# Patient Record
Sex: Female | Born: 1935 | Race: White | Hispanic: No | Marital: Single
Health system: Southern US, Community
[De-identification: ages and names within clinical notes are randomized; demographics above are authoritative.]

---

## 2013-06-01 ENCOUNTER — Inpatient Hospital Stay: Payer: Self-pay | Admitting: Internal Medicine

## 2013-06-01 LAB — CBC
HCT: 39.6 % (ref 35.0–47.0)
HGB: 13.5 g/dL (ref 12.0–16.0)
MCHC: 34.1 g/dL (ref 32.0–36.0)
Platelet: 230 10*3/uL (ref 150–440)
RBC: 4.13 10*6/uL (ref 3.80–5.20)
RDW: 12.6 % (ref 11.5–14.5)
WBC: 6.4 10*3/uL (ref 3.6–11.0)

## 2013-06-01 LAB — URINALYSIS, COMPLETE
Blood: NEGATIVE
Ketone: NEGATIVE
Leukocyte Esterase: NEGATIVE
Nitrite: NEGATIVE
Protein: NEGATIVE
RBC,UR: 1 /HPF (ref 0–5)
Specific Gravity: 1.008 (ref 1.003–1.030)
Squamous Epithelial: <1
WBC UR: <1 /HPF (ref 0–5)

## 2013-06-01 LAB — TROPONIN I: Troponin-I: <0.02 ng/mL

## 2013-06-01 LAB — BASIC METABOLIC PANEL
BUN: 8 mg/dL (ref 7–18)
Calcium, Total: 8.6 mg/dL (ref 8.5–10.1)
Chloride: 95 mmol/L — ABNORMAL LOW (ref 98–107)
Osmolality: 257 (ref 275–301)

## 2013-06-01 LAB — URIC ACID: Uric Acid: 3.6 mg/dL (ref 2.6–6.0)

## 2013-06-02 LAB — CBC WITH DIFFERENTIAL/PLATELET
Basophil #: 0 10*3/uL (ref 0.0–0.1)
Basophil %: 0.4 %
Eosinophil #: 0 10*3/uL (ref 0.0–0.7)
Eosinophil %: 0 %
HGB: 11.7 g/dL — ABNORMAL LOW (ref 12.0–16.0)
Lymphocyte #: 0.6 10*3/uL — ABNORMAL LOW (ref 1.0–3.6)
Lymphocyte %: 25.2 %
MCH: 33 pg (ref 26.0–34.0)
Monocyte #: 0.2 x10 3/mm (ref 0.2–0.9)
Neutrophil #: 1.6 10*3/uL (ref 1.4–6.5)
Platelet: 210 10*3/uL (ref 150–440)
RBC: 3.56 10*6/uL — ABNORMAL LOW (ref 3.80–5.20)
RDW: 12.7 % (ref 11.5–14.5)
WBC: 2.4 10*3/uL — ABNORMAL LOW (ref 3.6–11.0)

## 2013-06-02 LAB — BASIC METABOLIC PANEL
Anion Gap: 8 (ref 7–16)
Chloride: 99 mmol/L (ref 98–107)
EGFR (African American): >60
Glucose: 145 mg/dL — ABNORMAL HIGH (ref 65–99)
Osmolality: 267 (ref 275–301)
Potassium: 3.8 mmol/L (ref 3.5–5.1)

## 2013-06-03 LAB — CBC WITH DIFFERENTIAL/PLATELET
Basophil #: 0 10*3/uL (ref 0.0–0.1)
Eosinophil %: 0 %
HCT: 32 % — ABNORMAL LOW (ref 35.0–47.0)
HGB: 11 g/dL — ABNORMAL LOW (ref 12.0–16.0)
Lymphocyte %: 12.9 %
MCHC: 34.4 g/dL (ref 32.0–36.0)
MCV: 97 fL (ref 80–100)
Monocyte #: 0.6 x10 3/mm (ref 0.2–0.9)
Monocyte %: 10.8 %
Neutrophil %: 76.1 %
Platelet: 188 10*3/uL (ref 150–440)
RBC: 3.31 10*6/uL — ABNORMAL LOW (ref 3.80–5.20)
WBC: 5.3 10*3/uL (ref 3.6–11.0)

## 2013-06-03 LAB — BASIC METABOLIC PANEL
Anion Gap: 4 — ABNORMAL LOW (ref 7–16)
BUN: 12 mg/dL (ref 7–18)
Co2: 27 mmol/L (ref 21–32)
Creatinine: 0.31 mg/dL — ABNORMAL LOW (ref 0.60–1.30)
EGFR (African American): >60
Glucose: 126 mg/dL — ABNORMAL HIGH (ref 65–99)
Osmolality: 273 (ref 275–301)
Potassium: 4.2 mmol/L (ref 3.5–5.1)

## 2013-06-06 LAB — CULTURE, BLOOD (SINGLE)

## 2013-07-05 DEATH — deceased

## 2014-09-24 NOTE — Discharge Summary (Signed)
PATIENT NAME:  Rebecca DeutscherSAWYER, Falan MR#:  [id number] DATE OF BIRTH:  06-22-1926  DATE OF ADMISSION:  06/01/2013 DATE OF DISCHARGE:  06/03/2013  ADMISSION DIAGNOSIS:  1.  Acute respiratory failure from acute chronic obstructive pulmonary disease exacerbation.   DISCHARGE DIAGNOSES: 1.  Acute respiratory failure from acute chronic obstructive pulmonary disease exacerbation.  2.  Acute chronic obstructive pulmonary disease exacerbation.  3.  Hyponatremia.  4.  Ankle pain.  5.  Dementia.   CONSULTATIONS: None.   Blood cultures are negative to date. Sodium 136, potassium 4.2, chloride 105, bicarb 27, BUN 12, creatinine 0.31. Glucose is 126. White blood cells 5.3, hemoglobin 11, hematocrit 32. Platelets are 188.   Foot x-ray shows no acute fracture.   HOSPITAL COURSE: An 81 year old female who presented with shortness of breath, found to be in acute respiratory failure from acute chronic obstructive pulmonary disease exacerbation. For further details, please refer to the H and P.  1.  Acute respiratory failure secondary to acute chronic obstructive pulmonary disease exacerbation as outlined below.  2.  Acute chronic obstructive pulmonary disease exacerbation. The patient was placed on IV steroids, oxygen, nebulizers and inhalers. She is actually doing remarkably well. On discharge, she will need oxygen for acute respiratory failure secondary to COPD, and she will need antibiotics. She is tolerating her nebulizers and steroids well. Her lungs are clear with very minimal wheezing. She has very good air flow, speaking in full sentences and not complaining of shortness of breath.  3.  Hyponatremia from dehydration, which improved with IV fluids.  4.  Ankle pain. The patient had some left ankle pain. X-ray was performed, which was negative.  5.  Dementia. The patient seemed to be at her baseline.   DISCHARGE MEDICATIONS: 1.  Folic acid 1 tablet daily.  2.  Lisinopril 20 mg daily.  3.  Vitamin B-50 Complex  1 tablet daily.  4.  Advair Diskus 250/50 b.i.d.  5.  Lamictal 25 mg b.i.d.  6.  ProAir 2 puffs 4 times a day.  7.  Prednisone taper starting at 60 mg, taper by 10 mg every 2 days.  8.  Levaquin 500 mg daily for 4 days.  9.  Albuterol/ipratropium inhalation q.6 hours p.r.n.   Discharge home health with physical therapy and nurse.   DISCHARGE OXYGEN: Three liters, keeping sats greater than 90%.   DISCHARGE DIET: Regular diet.   DISCHARGE FOLLOWUP:  The patient will need to follow up with her primary care physician.   The patient is medically stable for discharge.   TIME SPENT: 35 minutes.     ____________________________ Janyth ContesSital P. Juliene PinaMody, MD spm:dmm D: 06/03/2013 11:41:22 ET T: 06/03/2013 11:56:01 ET JOB#: [id number]  cc: Alyzza Andringa P. Juliene PinaMody, MD, <Dictator> Janyth ContesSITAL P Tuyet Bader MD ELECTRONICALLY SIGNED 06/03/2013 16:12

## 2014-09-25 NOTE — H&P (Signed)
PATIENT NAME:  Rebecca DeutscherSAWYER, Madyn MR#:  [id number] DATE OF BIRTH:  11-02-1926  DATE OF ADMISSION:  06/01/2013  ADDENDUM  Another problem, hyponatremia. Unknown if this is a chronic or acute process. We are going to put her on IV fluids 75 an hour of isotonic saline and we are going to check her urine sodium, urine osmolality and uric acid. If it seems to be more related to syndrome of inappropriate antidiuretic hormone, we are going to stop the fluids, but if it seems to be more secondary to dehydration due to her chronic obstructive pulmonary disease exacerbation, we will continue to administer saline solution.   ____________________________ Felipa Furnaceoberto Sanchez Gutierrez, MD rsg:sb D: 06/01/2013 15:34:05 ET T: 06/01/2013 15:47:11 ET JOB#: [id number]  cc: Felipa Furnaceoberto Sanchez Gutierrez, MD, <Dictator> Azarya Oconnell Juanda ChanceSANCHEZ GUTIERRE MD ELECTRONICALLY SIGNED 06/26/2013 0:44

## 2014-09-25 NOTE — H&P (Signed)
PATIENT NAME:  Rebecca Vazquez, Rebecca Vazquez MR#:  [id number] DATE OF BIRTH:  May 23, 1927  DATE OF ADMISSION:  06/01/2013  PRIMARY CARE PHYSICIAN: Care nursing facility.   REFERRING PHYSICIAN: Dorothea GlassmanPaul Malinda, MD.    CHIEF COMPLAINT: Status post fall with increased shortness of breath and cough.   HISTORY OF PRESENT ILLNESS: This is a very nice 81 year old female who has history of being healthy for many years, not taking many medications up until the point that social services were called by neighbors as the patient became unable to take care of her mentally handicapped son. Three months ago, they were placed into facilities. The son went into a group home and she went to skilled nursing facility.  [place].   Now she is brought in by her daughter because the patient had a fall today. Apparently, she tripped and fell after trying to move around the facility. Apparently, as the daughter can tell me, she has been feisty and she does not want to stay in bed. She is able to mobilize herself mostly with her walker, but occasionally she does not use it. Today she just tripped and fell and landed forward, hitting her nose and her face. She had a skin cancer, or what appeared to be a skin cancer, on her forehead, and after she fell, she shaved it off.   The patient was> evaluated in the ER by Dr. Darnelle CatalanMalinda, and he found it to be hypoxic with oxygen saturation around 84% whenever she got up and moved around. When I am talking to the patient, her oxygen saturation lingers around 89 to 90, and this is at rest.   The patient has been coughing for the past 3 days with significant amount of phlegm. She denies any fever but she has significant cough and it does feel very congested.   The patient is admitted for treatment of COPD exacerbation status post fall, unknown if the fall was caused due to her shortness of breath or just because of her imbalance.   REVIEW OF SYSTEMS: CONSTITUTIONAL: No fever, fatigue or  weakness.  EYES: No blurry vision, double vision.  ENT:  No difficulty swallowing or postnasal drip.  RESPIRATORY: Positive cough. Positive wheezing. No hemoptysis. Positive dyspnea. Positive COPD.  CARDIOVASCULAR: No chest pain, orthopnea, edemas or arrhythmias.  GASTROINTESTINAL: No nausea, vomiting, or diarrhea. No abdominal pain. No melena or changes in bowel function.  GENITOURINARY: No dysuria, hematuria, or changes in frequency.  ENDOCRINE: No polyuria, polydipsia, polyphagia.  HEMOLYMPHATIC: No anemia, easy bruising or bleeding.  SKIN: No rashes or petechiae. MUSCULOSKELETAL: No significant neck pain, back pain or joint swelling.  NEUROLOGIC: No numbness, tingling, or fatigue.  PSYCHIATRIC: No insomnia or depression. Possible diagnosis of dementia but not confirmed.   PAST MEDICAL HISTORY:  1.  COPD.   2.  Possible dementia.  3.  Hypertension.   ALLERGIES: No known drug allergies.   PAST SURGICAL HISTORY: The patient says that she had a stomach surgery but is not aware of what was the reason of that. She apparently had surgery on her legs at some point, but I do not see any significant scar.   FAMILY HISTORY: No MI. No cancer. No diabetes. No hypertension. The patient denies any previous history.   SOCIAL HISTORY: The patient was a smoker of 3 packs per day for the past 60 years. She quit 3 months ago when she was placed in a skilled nursing facility. No alcohol. She lives in a skilled nursing  facility. She moved there from [place], and again the reason was because social services were involved as she was not able to take care of her mentally handicapped son.   CURRENT MEDICATIONS: Vitamin B once a day, ProAir 2 puffs four times a day as needed, lisinopril 20 mg once daily, Lamictal 25 mg twice daily, folic acid 1 mg once daily, Advair 250/ 50 two times daily.   PHYSICAL EXAMINATION:  VITAL SIGNS: Oxygen saturation now down to 89 on room air at 8. Down to 80 whenever she gets  up and moves around. Her pulse 105, respirations 20, temperature 98.4, blood pressure 167/71.  HEENT: Pupils are equal and reactive. Extraocular movements are intact. Mucosae are moist. Anicteric sclerae. Pink conjunctivae. No oral lesions. No oropharyngeal exudates.  NECK: Supple. No JVD. No thyromegaly. No adenopathy. No carotid bruits. No rigidity.  CARDIOVASCULAR: Regular rate and rhythm. No murmurs, rubs, or gallops are appreciated. No displacement of PMI.  LUNGS: Overall very congested with significant rhonchi, diffuse all over her lung fields. No use of accessory muscles.  ABDOMEN: Soft, nontender, nondistended. No hepatosplenomegaly. No masses. Bowel sounds are positive.  GENITAL: Deferred.  EXTREMITIES: No edema, cyanosis or clubbing.  VASCULAR: Pulses +2. Capillary refill less than 3.  PSYCHIATRIC: Negative for agitation or depression.  NEUROLOGIC: Cranial nerves II through XII intact.  LYMPH: Negative for lymphadenopathy in neck or supraclavicular areas.  MUSCULOSKELETAL: No joint effusions or swelling.   LABORATORY RESULTS: Glucose is 100, creatinine 0.5, sodium 129, potassium 3.7. Troponin 0.02. White count 6.4, hemoglobin 13.5, platelets 230. Urinalysis negative for infection.   CHEST X-RAY: Mild opacification of the lingula with curvilinear component. Findings likely due to infection or atelectasis.   CT OF THE HEAD: No acute intracranial process C-spine, chronic changes.  CT CHEST WITHOUT CONTRAST:  Linear atelectasis over the lingula and minimal patchy linear scarring bilateral, solid nodule areas of hazy attenuation bilateral that might be due to an infectious or inflammatory process.   EKG: Normal sinus rhythm with PACs. No acute ST depression or elevation. Possible inferior previous MI.   ASSESSMENT AND PLAN: This is a very nice 81 year old female with history of hypertension, chronic obstructive pulmonary disease, possible beginnings of dementia, who comes today with an  acute fall and exacerbation of her chronic obstructive pulmonary disease.  1.  Fall. The patient is actually unsteady. She needs to be compliant with her walker. At this moment, we are going to admit her and do some physical therapy to evaluate her balance and do some further recommendations.  2.  Acute respiratory failure. Oxygen saturation dropping down to the 80s.  3.  The patient was  tachypneic up to 22 and got tachycardic up to 105.  4.  The patient has significant wheezing and crepitus. This is secondary to chronic obstructive pulmonary disease exacerbation.  5.  Chronic obstructive pulmonary disease exacerbation. The patient is admitted for treatment of this condition. She had significant wheezing, diffuse oxygen saturation. No fever, no chills, although the patient has significant findings on the chest x-ray and CT scan that could be atelectasis of the lingula versus new infiltrate. At this moment, since the patient is going to be admitted, we are going to treat her for chronic obstructive pulmonary disease exacerbation. I am going to get blood cultures in case this ends up being pneumonia. She does not have a fever or a white blood count, so it is much likely for this to be chronic obstructive pulmonary disease exacerbation with atelectasis,  although this cannot be ruled out by x-ray or CT scan, for what we are going to just go ahead and treat her.  6.  The patient is overall stable.  7.  Hypertension. The patient takes lisinopril at home. We are going to continue that. Blood pressure seems to be elevated, for what we are going to add some p.r.n. clonidine as well and monitor closely.  8.  Possible dementia. The patient is actually taking Lamictal likely due to behavioral issues with her dementia.  9.  Deep vein thrombosis prophylaxis with Lovenox.  10.  Gastrointestinal prophylaxis with Protonix.   TIME SPENT: I spent about 45 minutes with this patient.   CODE STATUS: DNR.     ____________________________ Felipa Furnace, MD rsg:np D: 06/01/2013 15:32:35 ET T: 06/01/2013 15:50:22 ET JOB#: [id number]  cc: Felipa Furnace, MD, <Dictator> Malvika Tung Juanda Chance MD ELECTRONICALLY SIGNED 06/26/2013 0:46

## 2015-02-25 IMAGING — CR DG CHEST 2V
1 series · 2 of 2 positions shown · non-contrast
Comparison: None.

CLINICAL DATA: Shortness of breath.

EXAM:
CHEST  2 VIEW

[Series 1: x chest ap · 0.14mm/px · 2 of 2 slices shown]
[im 1/2]
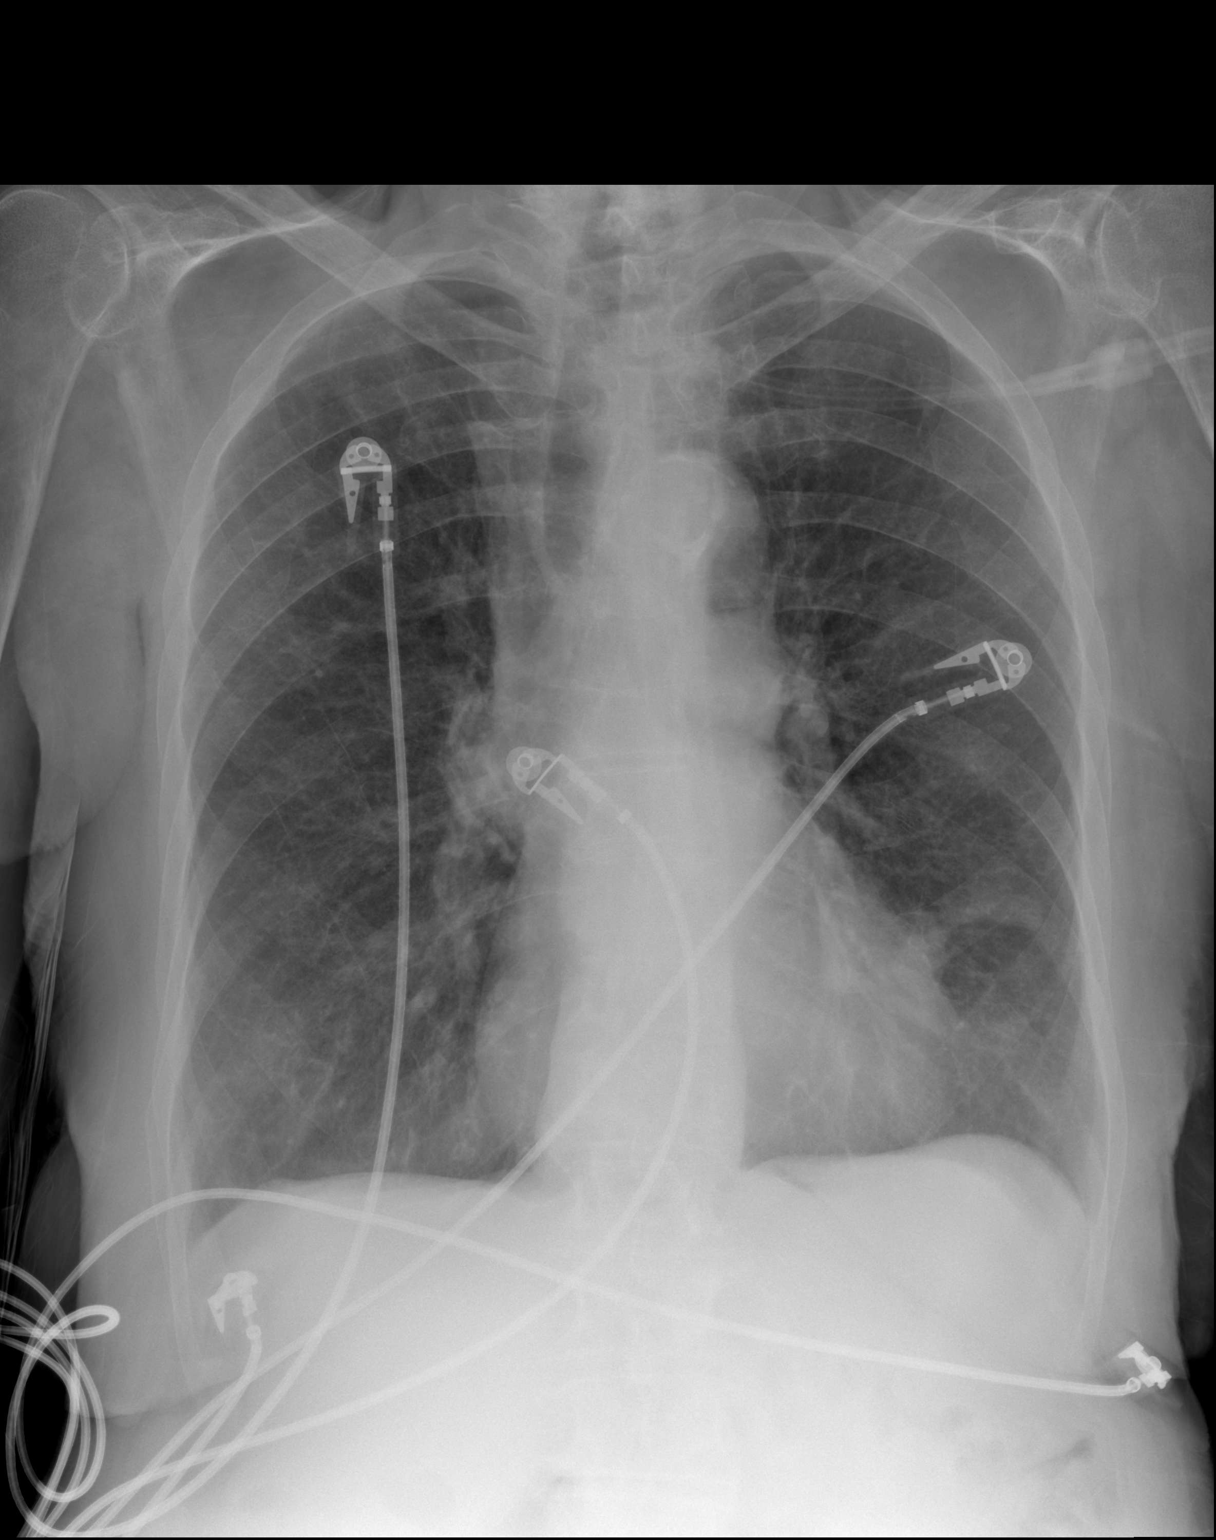
[im 2/2]
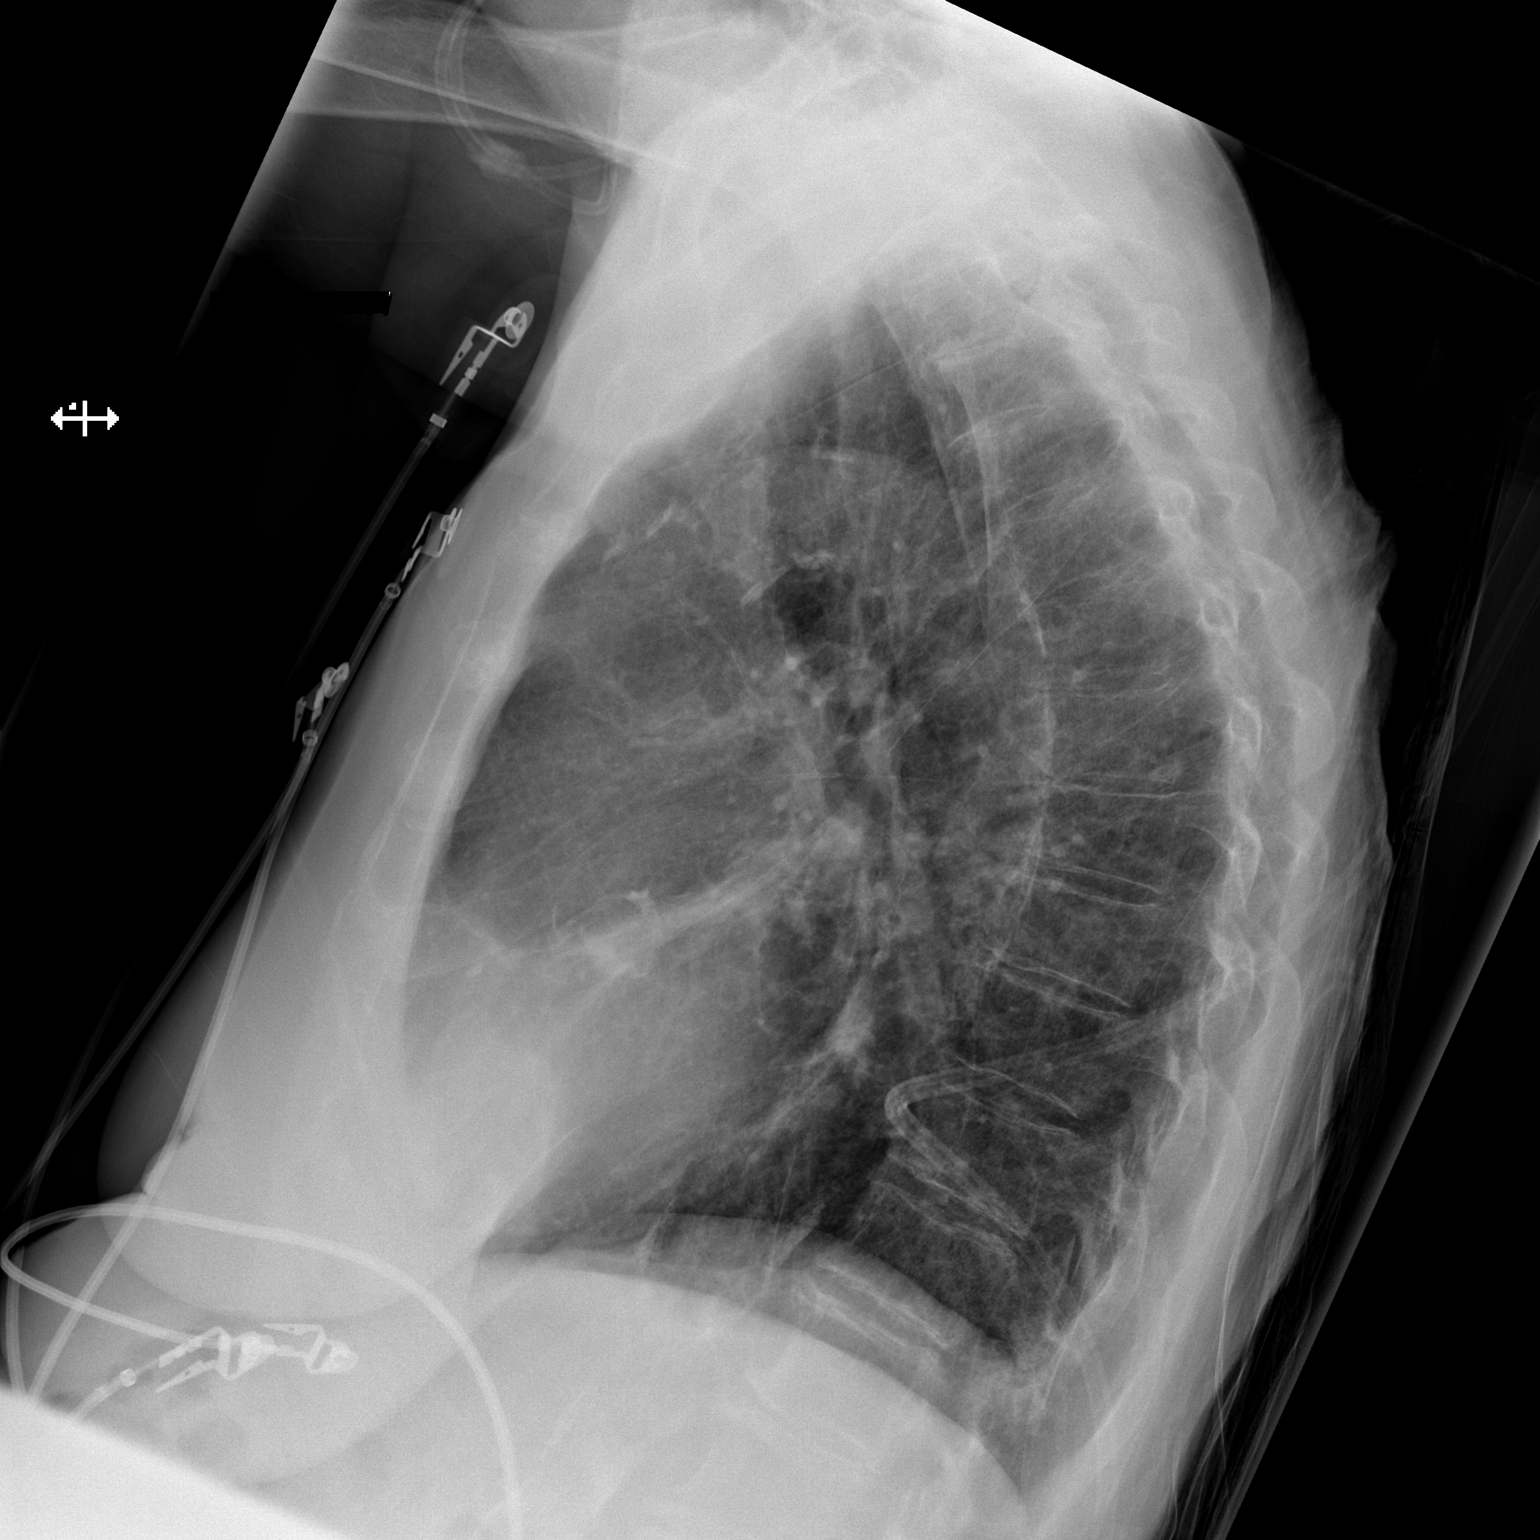

[2 of 2 positions shown; findings below may reference images not displayed]

FINDINGS: Lungs are adequately inflated without evidence of effusion or
pneumothorax. There is opacification with curvilinear density over
the lingula. Cardiomediastinal silhouette is within normal. There is
calcified plaque over the thoracic aorta. There is mild spondylosis
of the spine.
IMPRESSION: Mild opacification over the lingula with curvilinear component.
Findings are likely due to infection or atelectasis. Recommend
follow-up to resolution.
